# Patient Record
Sex: Male | Born: 1990
Health system: Southern US, Community
[De-identification: ages and names within clinical notes are randomized; demographics above are authoritative.]

## PROBLEM LIST (undated history)

## (undated) DIAGNOSIS — T7840XA Allergy, unspecified, initial encounter: Secondary | ICD-10-CM

## (undated) DIAGNOSIS — Z720 Tobacco use: Secondary | ICD-10-CM

## (undated) DIAGNOSIS — E781 Pure hyperglyceridemia: Secondary | ICD-10-CM

## (undated) DIAGNOSIS — I10 Essential (primary) hypertension: Secondary | ICD-10-CM

## (undated) DIAGNOSIS — E669 Obesity, unspecified: Secondary | ICD-10-CM

## (undated) HISTORY — PX: MANDIBLE SURGERY: SHX707

## (undated) HISTORY — DX: Tobacco use: Z72.0

## (undated) HISTORY — PX: WISDOM TOOTH EXTRACTION: SHX21

## (undated) HISTORY — DX: Essential (primary) hypertension: I10

## (undated) HISTORY — DX: Obesity, unspecified: E66.9

## (undated) HISTORY — DX: Allergy, unspecified, initial encounter: T78.40XA

---
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
## 2017-06-30 ENCOUNTER — Ambulatory Visit: Payer: Self-pay | Admitting: Physician Assistant

## 2017-06-30 DIAGNOSIS — Z0189 Encounter for other specified special examinations: Secondary | ICD-10-CM

## 2017-09-10 ENCOUNTER — Encounter (INDEPENDENT_AMBULATORY_CARE_PROVIDER_SITE_OTHER): Payer: Self-pay

## 2017-09-10 ENCOUNTER — Ambulatory Visit (INDEPENDENT_AMBULATORY_CARE_PROVIDER_SITE_OTHER): Payer: BLUE CROSS/BLUE SHIELD | Admitting: Physician Assistant

## 2017-09-10 ENCOUNTER — Encounter: Payer: Self-pay | Admitting: Physician Assistant

## 2017-09-10 VITALS — BP 121/79 | HR 70 | Ht 71.0 in | Wt 218.0 lb

## 2017-09-10 DIAGNOSIS — F341 Dysthymic disorder: Secondary | ICD-10-CM | POA: Diagnosis not present

## 2017-09-10 DIAGNOSIS — Z6830 Body mass index (BMI) 30.0-30.9, adult: Secondary | ICD-10-CM | POA: Diagnosis not present

## 2017-09-10 DIAGNOSIS — H6122 Impacted cerumen, left ear: Secondary | ICD-10-CM | POA: Diagnosis not present

## 2017-09-10 DIAGNOSIS — F17200 Nicotine dependence, unspecified, uncomplicated: Secondary | ICD-10-CM | POA: Insufficient documentation

## 2017-09-10 DIAGNOSIS — F4010 Social phobia, unspecified: Secondary | ICD-10-CM | POA: Diagnosis not present

## 2017-09-10 DIAGNOSIS — E6609 Other obesity due to excess calories: Secondary | ICD-10-CM | POA: Diagnosis not present

## 2017-09-10 DIAGNOSIS — Z7689 Persons encountering health services in other specified circumstances: Secondary | ICD-10-CM | POA: Diagnosis not present

## 2017-09-10 NOTE — Patient Instructions (Signed)
Persistent Depressive Disorder, Adult Persistent depressive disorder (PDD) is a mental health condition that causes symptoms of low-level depression for 2 years or longer. It may also be called long-term (chronic) depression or dysthymia. PDD may include episodes of more severe depression that last for about 2 weeks (major depressive disorder or MDD). PDD can affect the way you think, feel, and sleep. This condition may also affect your relationships. You may be more likely to get sick if you have PDD. What are the causes? The exact cause of this condition is not known. PDD is most likely caused by a combination of things, which may include:  Genetic factors. These are traits that are passed along from parent to child.  Individual factors. Your personality, your behavior, and the way you handle your thoughts and feelings may contribute to PDD. This includes personality traits and behaviors learned from others.  Physical factors, such as: ? Differences in the part of your brain that controls emotion. This part of your brain may be different than it is in people who do not have PDD. ? Long-term (chronic) medical or psychiatric illnesses.  Social factors. Traumatic experiences or major life changes may play a role in the development of PDD.  What increases the risk? This condition is more likely to develop in women. The following factors may make you more likely to develop PDD:  A family history of depression.  Abnormally low levels of certain brain chemicals.  Traumatic events in childhood, especially abuse or the loss of a parent.  Being under a lot of stress, or long-term stress, especially from upsetting life experiences or losses.  A history of: ? Chronic physical illness. ? Other mental health disorders. ? Substance abuse.  Poor living conditions.  Experiencing social exclusion or discrimination on a regular basis.  What are the signs or symptoms? Symptoms of this condition  occur for most of the day, and may include:  Fatigue or low energy.  Eating too much or too little.  Sleeping too much or too little.  Restlessness or agitation.  Feelings of hopelessness.  Feeling worthless or guilty.  Anxiety.  Poor concentration or difficulty making decisions.  Low self-esteem.  Negative outlook.  Inability to have fun or experience pleasure.  Social withdrawal.  Unexplained physical complaints.  Irritability.  Aggressive behavior or anger.  How is this diagnosed? This condition may be diagnosed based on:  Your symptoms.  Your medical history, including your mental health history. This may involve tests to evaluate your mental health. You may be asked questions about your lifestyle, including any drug and alcohol use, and how long you have had symptoms of PDD.  A physical exam.  Blood tests to rule out other conditions.  You may be diagnosed with PDD if you have had a depressed mood for 2 years or longer, as well as other symptoms of depression. How is this treated? This condition is usually treated by mental health professionals, such as psychologists, psychiatrists, and clinical social workers. You may need more than one type of treatment. Treatment may include:  Psychotherapy. This is also called talk therapy or counseling. Types of psychotherapy include: ? Cognitive behavioral therapy (CBT). This type of therapy teaches you to recognize unhealthy feelings, thoughts, and behaviors, and replace them with positive thoughts and actions. ? Interpersonal therapy (IPT). This helps you to improve the way you relate to and communicate with others. ? Family therapy. This treatment includes members of your family.  Medicine to treat anxiety and   depression, or to help you control certain emotions and behaviors.  Lifestyle changes, such as: ? Limiting alcohol and drug use. ? Exercising regularly. ? Getting plenty of sleep. ? Making healthy eating  choices. ? Spending more time outdoors.  Follow these instructions at home: Activity  Return to your normal activities as told by your health care provider.  Exercise regularly and spend time outdoors as told by your health care provider. General instructions  Take over-the-counter and prescription medicines only as told by your health care provider.  Do not drink alcohol. If you drink alcohol, limit your alcohol intake to no more than 1 drink a day for nonpregnant women and 2 drinks a day for men. One drink equals 12 oz of beer, 5 oz of wine, or 1 oz of hard liquor. Alcohol can affect any antidepressant medicines you are taking. Talk to your health care provider about your alcohol use.  Eat a healthy diet and get plenty of sleep.  Find activities that you enjoy doing, and make time to do them.  Consider joining a support group. Your health care provider may be able to recommend a support group.  Keep all follow-up visits as told by your health care provider. This is important. Where to find more information: National Alliance on Mental Illness  www.nami.org  U.[place]. National Institute of Mental Health  www.nimh.nih.gov  National Suicide Prevention Lifeline  1-800-273-TALK (8255). This is free, 24-hour help.  Contact a health care provider if:  Your symptoms get worse.  You develop new symptoms.  You have trouble sleeping or doing your daily activities. Get help right away if:  You self-harm.  You have serious thoughts about hurting yourself or others.  You see, hear, taste, smell, or feel things that are not present (hallucinate). This information is not intended to replace advice given to you by your health care provider. Make sure you discuss any questions you have with your health care provider. Document Released: 11/23/2012 Document Revised: 08/06/2016 Document Reviewed: 06/20/2016 Elsevier Interactive Patient Education  2017 Elsevier Inc.  

## 2017-09-10 NOTE — Progress Notes (Signed)
HPI:                                                                Kevin Henry is a 26 y.o. male who presents to [name]: Primary Care Sports Medicine today to establish care  Current concerns include: left ear pain  Otalgia   There is pain in the left ear. This is a new problem. The current episode started in the past 7 days (x 2 days). The problem occurs constantly. The problem has been gradually worsening. There has been no fever. The pain is mild. Associated symptoms include rhinorrhea. Pertinent negatives include no coughing, ear discharge or sore throat. Associated symptoms comments: + ear fullness. Treatments tried: H2O2. There is no history of a chronic ear infection.    Past Medical History:  Diagnosis Date  . Allergy   . Obesity   . Tobacco use    Past Surgical History:  Procedure Laterality Date  . MANDIBLE SURGERY    . WISDOM TOOTH EXTRACTION     Social History  Substance Use Topics  . Smoking status: Never Smoker  . Smokeless tobacco: Never Used  . Alcohol use 2.4 oz/week    4 Cans of beer per week   family history includes Breast cancer in his maternal grandmother; Depression in his mother; Diabetes in his maternal grandfather, maternal grandmother, paternal grandfather, and paternal grandmother; Hypertension in his father and mother.  ROS: Review of Systems  Constitutional: Negative.   HENT: Positive for ear pain and rhinorrhea. Negative for ear discharge and sore throat.   Eyes: Negative.   Respiratory: Negative for cough.   Cardiovascular: Negative.   Gastrointestinal: Negative.   Genitourinary: Negative.   Musculoskeletal: Negative.   Skin: Negative.   Neurological: Negative.   Endo/Heme/Allergies: Positive for environmental allergies.  Psychiatric/Behavioral: Positive for depression. Negative for suicidal ideas. The patient is nervous/anxious. The patient does not have insomnia.     Medications: Current Outpatient  Prescriptions  Medication Sig Dispense Refill  . loratadine (CLARITIN) 10 MG tablet Take 10 mg by mouth as needed.     No current facility-administered medications for this visit.    No Known Allergies     Objective:  BP 121/79   Pulse 70   Ht  (1.803 m)   Wt 218 lb (98.9 kg)   BMI 30.40 kg/m  Gen:  alert, not ill-appearing, no distress, appropriate for age HEENT: head normocephalic without obvious abnormality, conjunctiva and cornea clear, left TM obstructed by cerumen, no mastoid tenderness, right TM clear, neck supple, trachea midline Pulm: Normal work of breathing, normal phonation, clear to auscultation bilaterally, no wheezes, rales or rhonchi CV: Normal rate, regular rhythm, s1 and s2 distinct, no murmurs, clicks or rubs  Neuro: alert and oriented x 3, no tremor MSK: extremities atraumatic, normal gait and station Skin: intact, no rashes on exposed skin, no jaundice, no cyanosis Psych: well-groomed, cooperative, good eye contact, euthymic mood, affect mood-congruent, speech is articulate, and thought processes clear and goal-directed    No results found for this or any previous visit (from the past 72 hour(s)). No results found.  Depression screen PHQ 2/9 09/10/2017  Decreased Interest 2  Down, Depressed, Hopeless 2  PHQ - 2 Score 4  Altered  sleeping 3  Tired, decreased energy 1  Change in appetite 0  Feeling bad or failure about yourself  3  Trouble concentrating 0  Moving slowly or fidgety/restless 0  Suicidal thoughts 0  PHQ-9 Score 11  Difficult doing work/chores Somewhat difficult      Assessment and Plan: 26 y.o. male with   1. Encounter to establish care - reviewed PMH, PSH, PFH, medications and allergies - reviewed health maintenance - declines influenza  2. Tobacco use disorder - declines smoking cessation  3. Dysthymia, Social anxiety disorder - PHQ9 score 11. No acute safety issues - discussed treatment options to include SSRI  and/or CBT. Patient declines at this point - encouraged exercise, sleep hygiene, and healthy lifestyle  4. Left ear impacted cerumen Ceruminosis is noted.  Wax is removed with Colace and syringing by CMA. TM visualized and intact  5. Class 1 obesity due to excess calories without serious comorbidity with body mass index (BMI) of 30.0 to 30.9 in adult - heart healthy lifestyle. Continue regular cardiovascular exercise 150 minutes per week   Patient education and anticipatory guidance given Patient agrees with treatment plan Follow-up  In 3 months for CPE w/fasting labs or sooner as needed if symptoms worsen or fail to improve  Levonne Hubert PA-C

## 2018-02-23 ENCOUNTER — Ambulatory Visit (INDEPENDENT_AMBULATORY_CARE_PROVIDER_SITE_OTHER): Payer: BLUE CROSS/BLUE SHIELD | Admitting: Physician Assistant

## 2018-02-23 ENCOUNTER — Encounter: Payer: Self-pay | Admitting: Physician Assistant

## 2018-02-23 VITALS — BP 131/86 | HR 95 | Temp 100.5°F | Wt 236.0 lb

## 2018-02-23 DIAGNOSIS — R509 Fever, unspecified: Secondary | ICD-10-CM | POA: Diagnosis not present

## 2018-02-23 DIAGNOSIS — J111 Influenza due to unidentified influenza virus with other respiratory manifestations: Secondary | ICD-10-CM

## 2018-02-23 DIAGNOSIS — R69 Illness, unspecified: Secondary | ICD-10-CM

## 2018-02-23 LAB — POCT INFLUENZA A/B
Influenza A, POC: NEGATIVE
Influenza B, POC: NEGATIVE

## 2018-02-23 MED ORDER — GUAIFENESIN ER 600 MG PO TB12: 1200.0000 mg | ORAL_TABLET | Freq: Two times a day (BID) | ORAL | Status: DC

## 2018-02-23 MED ORDER — OSELTAMIVIR PHOSPHATE 75 MG PO CAPS: 75.0000 mg | ORAL_CAPSULE | Freq: Two times a day (BID) | ORAL | 0 refills | Status: DC

## 2018-02-23 NOTE — Patient Instructions (Signed)

## 2018-02-23 NOTE — Progress Notes (Signed)
HPI:                                                                Kevin Henry is a 27 y.o. male who presents to [name]: Primary Care Sports Medicine today for influenza-like illness  Influenza  This is a new problem. The current episode started yesterday. The problem occurs constantly. Associated symptoms include chills, congestion, coughing, diaphoresis, fatigue, a fever and myalgias. Pertinent negatives include no abdominal pain, change in bowel habit, nausea, neck pain, rash, sore throat or vomiting. Nothing aggravates the symptoms. Treatments tried: Dayquil. The treatment provided no relief.      Depression screen PHQ 2/9 09/10/2017  Decreased Interest 2  Down, Depressed, Hopeless 2  PHQ - 2 Score 4  Altered sleeping 3  Tired, decreased energy 1  Change in appetite 0  Feeling bad or failure about yourself  3  Trouble concentrating 0  Moving slowly or fidgety/restless 0  Suicidal thoughts 0  PHQ-9 Score 11  Difficult doing work/chores Somewhat difficult    No flowsheet data found.    Past Medical History:  Diagnosis Date  . Allergy   . Obesity   . Tobacco use    Past Surgical History:  Procedure Laterality Date  . MANDIBLE SURGERY    . WISDOM TOOTH EXTRACTION     Social History   Tobacco Use  . Smoking status: Never Smoker  . Smokeless tobacco: Never Used  Substance Use Topics  . Alcohol use: Yes    Alcohol/week: 2.4 oz    Types: 4 Cans of beer per week   family history includes Breast cancer in his maternal grandmother; Depression in his mother; Diabetes in his maternal grandfather, maternal grandmother, paternal grandfather, and paternal grandmother; Hypertension in his father and mother.    ROS: negative except as noted in the HPI  Medications: Current Outpatient Medications  Medication Sig Dispense Refill  . loratadine (CLARITIN) 10 MG tablet Take 10 mg by mouth as needed.    Marland Kitchen guaiFENesin (MUCINEX) 600 MG 12 hr  tablet Take 2 tablets (1,200 mg total) by mouth 2 (two) times daily.    Marland Kitchen oseltamivir (TAMIFLU) 75 MG capsule Take 1 capsule (75 mg total) by mouth 2 (two) times daily. 10 capsule 0   No current facility-administered medications for this visit.    No Known Allergies     Objective:  BP 131/86   Pulse 95   Temp (!) 100.5 F (38.1 C) (Oral)   Wt 236 lb (107 kg)   BMI 32.92 kg/m  Gen:  alert,  ill-appearing, not toxic-appearing, no distress, appropriate for age HEENT: head normocephalic without obvious abnormality, conjunctiva and cornea clear, nasal mucosa edematous, oropharynx clear, moist mucous membranes, neck supple, no cervical adenopathy, trachea midline Pulm: Normal work of breathing, normal phonation, clear to auscultation bilaterally, no wheezes, rales or rhonchi CV: Normal rate, regular rhythm, s1 and s2 distinct, no murmurs, clicks or rubs  Neuro: alert and oriented x 3, no tremor MSK: extremities atraumatic, normal gait and station Skin: intact, no rashes on exposed skin, no jaundice, no cyanosis   No results found for this or any previous visit (from the past 72 hour(s)). No results found.    Assessment and Plan: 27 y.o.  male with   1. Fever chills - POCT Influenza A/B negative  2. Influenza-like illness - he is within the window for Tamiflu. Will treat for influenza given hallmark symptoms. Vitals reviewed and stable. Counseled on symptomatic care. Work note provided - oseltamivir (TAMIFLU) 75 MG capsule; Take 1 capsule (75 mg total) by mouth 2 (two) times daily.  Dispense: 10 capsule; Refill: 0 - guaiFENesin (MUCINEX) 600 MG 12 hr tablet; Take 2 tablets (1,200 mg total) by mouth 2 (two) times daily.     Patient education and anticipatory guidance given Patient agrees with treatment plan Follow-up as needed if symptoms worsen or fail to improve  Levonne Hubertharley E. Cummings PA-C

## 2018-03-01 ENCOUNTER — Encounter: Payer: Self-pay | Admitting: Physician Assistant

## 2018-04-25 ENCOUNTER — Ambulatory Visit: Payer: BLUE CROSS/BLUE SHIELD | Admitting: Physician Assistant

## 2018-06-16 ENCOUNTER — Encounter: Payer: Self-pay | Admitting: Physician Assistant

## 2018-06-16 ENCOUNTER — Ambulatory Visit (INDEPENDENT_AMBULATORY_CARE_PROVIDER_SITE_OTHER): Payer: BLUE CROSS/BLUE SHIELD | Admitting: Physician Assistant

## 2018-06-16 VITALS — BP 135/77 | HR 81 | Wt 241.0 lb

## 2018-06-16 DIAGNOSIS — F17290 Nicotine dependence, other tobacco product, uncomplicated: Secondary | ICD-10-CM

## 2018-06-16 DIAGNOSIS — F410 Panic disorder [episodic paroxysmal anxiety] without agoraphobia: Secondary | ICD-10-CM | POA: Diagnosis not present

## 2018-06-16 DIAGNOSIS — F341 Dysthymic disorder: Secondary | ICD-10-CM

## 2018-06-16 NOTE — Progress Notes (Signed)
HPI:                                                                Kevin Henry is a 27 y.o. male who presents to [name]: Primary Care [facility] Medicine today for anxiety and depression  History is provided by the patient and his girlfriend.   Reports he thinks he has been depressed and struggled with anxiety since grade school. Cites not getting accepted to PT school as a major stressor and making him feel "like a failure." Endorses worsening anxiety and increased panic attacks over the last month. States patients and coworkers have noticed "something is wrong."  Girlfriend reports 4-5 panic attacks last week. No known triggers. Endorses excessive worry, shortness of breath, hyperventilation, palpitations, upset stomach.  Usual bedtime is 11pm, takes an hour or more to fall asleep. Usually wakes up around 6 am. Does not feel rested.  Currently vaping daily Drinks 1-2 beers nightly, reports drinking more after a panic attack  Denies illicit drug use  No history of psychiatric hospitalizations No prior medications  Has tried reducing caffeine intake and practicing sleep hygiene Exercises 2-3 days per week, but has found it harder to exercise lately Has been going fishing, which he reports is one of the few things that makes him feel better.  He also tried some relaxation apps/exercises, which he did not feel were helpful   Currently lives at home with his parents.  Has a bachelor's degree, graduated in 2017. 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 Long distance relationship  Family history: Mom and sister have anxiety disorders and depression Paternal aunt has bipolar disorder   Depression screen [facility] 2/9 06/16/2018 09/10/2017  Decreased Interest 1 2  Down, Depressed, Hopeless 1 2  PHQ - 2 Score 2 4  Altered sleeping 3 3  Tired, decreased energy 3 1   Change in appetite 3 0  Feeling bad or failure about yourself  3 3  Trouble concentrating 0 0  Moving slowly or fidgety/restless 0 0  Suicidal thoughts 0 0  PHQ-9 Score 14 11  Difficult doing work/chores - Somewhat difficult    GAD 7 : Generalized Anxiety Score 06/16/2018  Nervous, Anxious, on Edge 3  Control/stop worrying 3  Worry too much - different things 2  Trouble relaxing 1  Restless 2  Easily annoyed or irritable 1  Afraid - awful might happen 3  Total GAD 7 Score 15      Past Medical History:  Diagnosis Date  . Allergy   . Obesity   . Tobacco use    Past Surgical History:  Procedure Laterality Date  . MANDIBLE SURGERY    . WISDOM TOOTH EXTRACTION     Social History   Tobacco Use  . Smoking status: Never Smoker  . Smokeless tobacco: Never Used  Substance Use Topics  . Alcohol use: Yes    Alcohol/week: 8.4 oz    Types: 14 Cans of beer per week   family history includes Anxiety disorder in his mother and sister; Bipolar disorder in his paternal aunt; Breast cancer in his maternal grandmother; Depression in his mother and sister; Diabetes in his  maternal grandfather, maternal grandmother, paternal grandfather, and paternal grandmother; Hypertension in his father and mother.    ROS: negative except as noted in the HPI  Medications: Current Outpatient Medications  Medication Sig Dispense Refill  . loratadine (CLARITIN) 10 MG tablet Take 10 mg by mouth as needed.     No current facility-administered medications for this visit.    No Known Allergies     Objective:  BP 135/77   Pulse 81   Wt 241 lb (109.3 kg)   BMI 33.61 kg/m  Gen:  alert, not ill-appearing, no distress, appropriate for age HEENT: head normocephalic without obvious abnormality, conjunctiva and cornea clear, trachea midline Pulm: Normal work of breathing, normal phonation Neuro: alert and oriented x 3, no tremor MSK: extremities atraumatic, normal gait and station Skin: intact,  no rashes on exposed skin, no jaundice, no cyanosis Psych: well-groomed, cooperative, downward gaze, tearful throughout the visit, depressed mood, affect mood-congruent, mild psychomotor agitation, speech is articulate, and thought processes clear and goal-directed    No results found for this or any previous visit (from the past 72 hour(s)). No results found.    Assessment and Plan: 27 y.o. male with   Dysthymia  Social anxiety disorder  Other tobacco product nicotine dependence, uncomplicated  PHQ9=14, mildly moderate GAD7=15, moderately severe No acute safety issues Discussed treatment options in detail to include antidepressants such as SSRI/SNRI, cognitive behavioral therapy, as well as alternative therapies Patient declines referral to CBT, stating he does not think it will be helpful He is interested in medications, but would like time to research the options as he is concerned about side effects. Recommended he research Lexapro, Zoloft and Cymbalta specificially Encouraged reducing nicotine-containing products and alcohol Encouraged sleep hygiene and regular aerobic exercise  Patient education and anticipatory guidance given Patient agrees with treatment plan Follow-up in 1 month or sooner as needed if symptoms worsen or fail to improve  Levonne Hubertharley E. Cummings PA-C

## 2018-06-16 NOTE — Patient Instructions (Signed)
Medications to Research: - Escitalopram (Lexapro) - Sertraline (Zoloft) - Duloxetine (Cymbalta)  Other resources: - BelizeBus.at - mindbodygreen.com - 7cupsoftea - https://malone.com/ - online counseling: BetterHelp and Talkspace (not covered by insurance, but affordable self-pay rates)  Safety Plan: if having self-harm or suicidal thoughts Our Office 740-375-0023 [facility] Crisis Hotline (770)728-8491 National Suicide Hotline 1-800-SUICIDE If in immediate danger of harming yourself, go to the nearest emergency room or call 911   Living With Anxiety After being diagnosed with an anxiety disorder, you may be relieved to know why you have felt or behaved a certain way. It is natural to also feel overwhelmed about the treatment ahead and what it will mean for your life. With care and support, you can manage this condition and recover from it. How to cope with anxiety Dealing with stress Stress is your body's reaction to life changes and events, both good and bad. Stress can last just a few hours or it can be ongoing. Stress can play a major role in anxiety, so it is important to learn both how to cope with stress and how to think about it differently. Talk with your health care provider or a counselor to learn more about stress reduction. He or she may suggest some stress reduction techniques, such as:  Music therapy. This can include creating or listening to music that you enjoy and that inspires you.  Mindfulness-based meditation. This involves being aware of your normal breaths, rather than trying to control your breathing. It can be done while sitting or walking.  Centering prayer. This is a kind of meditation that involves focusing on a word, phrase, or sacred image that is meaningful to you and that brings you peace.  Deep breathing. To do this, expand your stomach and inhale slowly through your nose. Hold your  breath for 3-5 seconds. Then exhale slowly, allowing your stomach muscles to relax.  Self-talk. This is a skill where you identify thought patterns that lead to anxiety reactions and correct those thoughts.  Muscle relaxation. This involves tensing muscles then relaxing them.  Choose a stress reduction technique that fits your lifestyle and personality. Stress reduction techniques take time and practice. Set aside 5-15 minutes a day to do them. Therapists can offer training in these techniques. The training may be covered by some insurance plans. Other things you can do to manage stress include:  Keeping a stress diary. This can help you learn what triggers your stress and ways to control your response.  Thinking about how you respond to certain situations. You may not be able to control everything, but you can control your reaction.  Making time for activities that help you relax, and not feeling guilty about spending your time in this way.  Therapy combined with coping and stress-reduction skills provides the best chance for successful treatment. Medicines Medicines can help ease symptoms. Medicines for anxiety include:  Anti-anxiety drugs.  Antidepressants.  Beta-blockers.  Medicines may be used as the main treatment for anxiety disorder, along with therapy, or if other treatments are not working. Medicines should be prescribed by a health care provider. Relationships Relationships can play a big part in helping you recover. Try to spend more time connecting with trusted friends and family members. Consider going to couples counseling, taking family education classes, or going to family therapy. Therapy can help you and others better understand the condition. How to recognize changes in your condition Everyone has a different response to treatment for anxiety. Recovery from anxiety happens when  symptoms decrease and stop interfering with your daily activities at home or work. This may  mean that you will start to:  Have better concentration and focus.  Sleep better.  Be less irritable.  Have more energy.  Have improved memory.  It is important to recognize when your condition is getting worse. Contact your health care provider if your symptoms interfere with home or work and you do not feel like your condition is improving. Where to find help and support: You can get help and support from these sources:  Self-help groups.  Online and [employer]community organizations.  A trusted spiritual leader.  Couples counseling.  Family education classes.  Family therapy.  Follow these instructions at home:  Eat a healthy diet that includes plenty of vegetables, fruits, whole grains, low-fat dairy products, and lean protein. Do not eat a lot of foods that are high in solid fats, added sugars, or salt.  Exercise. Most adults should do the following: ? Exercise for at least 150 minutes each week. The exercise should increase your heart rate and make you sweat (moderate-intensity exercise). ? Strengthening exercises at least twice a week.  Cut down on caffeine, tobacco, alcohol, and other potentially harmful substances.  Get the right amount and quality of sleep. Most adults need 7-9 hours of sleep each night.  Make choices that simplify your life.  Take over-the-counter and prescription medicines only as told by your health care provider.  Avoid caffeine, alcohol, and certain over-the-counter cold medicines. These may make you feel worse. Ask your pharmacist which medicines to avoid.  Keep all follow-up visits as told by your health care provider. This is important. Questions to ask your health care provider  Would I benefit from therapy?  How often should I follow up with a health care provider?  How long do I need to take medicine?  Are there any long-term side effects of my medicine?  Are there any alternatives to taking medicine? Contact a health care provider  if:  You have a hard time staying focused or finishing daily tasks.  You spend many hours a day feeling worried about everyday life.  You become exhausted by worry.  You start to have headaches, feel tense, or have nausea.  You urinate more than normal.  You have diarrhea. Get help right away if:  You have a racing heart and shortness of breath.  You have thoughts of hurting yourself or others. If you ever feel like you may hurt yourself or others, or have thoughts about taking your own life, get help right away. You can go to your nearest emergency department or call:  Your local emergency services (911 in the [place]  A suicide crisis helpline, such as the National Suicide Prevention Lifeline at (628)370-08671-940-378-2434. This is open 24-hours a day.  Summary  Taking steps to deal with stress can help calm you.  Medicines cannot cure anxiety disorders, but they can help ease symptoms.  Family, friends, and partners can play a big part in helping you recover from an anxiety disorder. This information is not intended to replace advice given to you by your health care provider. Make sure you discuss any questions you have with your health care provider. Document Released: 12/01/2016 Document Revised: 12/01/2016 Document Reviewed: 12/01/2016 Elsevier Interactive Patient Education  [employer]2018 Elsevier Inc.

## 2018-06-17 ENCOUNTER — Encounter: Payer: Self-pay | Admitting: Physician Assistant

## 2018-06-17 DIAGNOSIS — F341 Dysthymic disorder: Secondary | ICD-10-CM

## 2018-06-17 DIAGNOSIS — F419 Anxiety disorder, unspecified: Secondary | ICD-10-CM

## 2018-06-17 MED ORDER — ESCITALOPRAM OXALATE 10 MG PO TABS
ORAL_TABLET | ORAL | 0 refills | Status: DC
Start: 2018-06-17 — End: 2018-07-15

## 2018-06-22 ENCOUNTER — Encounter: Payer: Self-pay | Admitting: Physician Assistant

## 2018-06-22 ENCOUNTER — Ambulatory Visit (INDEPENDENT_AMBULATORY_CARE_PROVIDER_SITE_OTHER): Payer: BLUE CROSS/BLUE SHIELD | Admitting: Physician Assistant

## 2018-06-22 VITALS — BP 117/79 | HR 78 | Temp 98.5°F | Wt 234.0 lb

## 2018-06-22 DIAGNOSIS — H1032 Unspecified acute conjunctivitis, left eye: Secondary | ICD-10-CM

## 2018-06-22 MED ORDER — POLYMYXIN B-TRIMETHOPRIM 10000-0.1 UNIT/ML-% OP SOLN: 2.0000 [drp] | Freq: Four times a day (QID) | OPHTHALMIC | 0 refills | Status: AC

## 2018-06-22 MED ORDER — KETOTIFEN FUMARATE 0.025 % OP SOLN: 1.0000 [drp] | Freq: Two times a day (BID) | OPHTHALMIC | 0 refills | Status: AC | PRN

## 2018-06-22 NOTE — Progress Notes (Signed)
HPI:                                                                Kevin Henry is a 27 y.o. male who presents to [name]: Primary Care Sports Medicine today for left eye redness  Conjunctivitis   The current episode started 2 days ago. The onset was sudden. The problem occurs continuously. The problem has been gradually worsening. The problem is moderate. Nothing relieves the symptoms. Nothing aggravates the symptoms. Associated symptoms include eye itching, eye discharge and eye redness. Pertinent negatives include no fever, no decreased vision, no double vision, no photophobia and no eye pain.   Past Medical History:  Diagnosis Date  . Allergy   . Obesity   . Tobacco use    Past Surgical History:  Procedure Laterality Date  . MANDIBLE SURGERY    . WISDOM TOOTH EXTRACTION     Social History   Tobacco Use  . Smoking status: Never Smoker  . Smokeless tobacco: Never Used  Substance Use Topics  . Alcohol use: Yes    Alcohol/week: 8.4 oz    Types: 14 Cans of beer per week   family history includes Anxiety disorder in his mother and sister; Bipolar disorder in his paternal aunt; Breast cancer in his maternal grandmother; Depression in his mother and sister; Diabetes in his maternal grandfather, maternal grandmother, paternal grandfather, and paternal grandmother; Hypertension in his father and mother.    ROS: negative except as noted in the HPI  Medications: Current Outpatient Medications  Medication Sig Dispense Refill  . escitalopram (LEXAPRO) 10 MG tablet Take 0.5 tablets (5 mg total) by mouth at bedtime for 3 days, THEN 1 tablet (10 mg total) at bedtime for 27 days. 30 tablet 0  . loratadine (CLARITIN) 10 MG tablet Take 10 mg by mouth as needed.    Marland Kitchen MELATONIN PO Take 3 mg by mouth.    Marland Kitchen ketotifen (ZADITOR) 0.025 % ophthalmic solution Place 1 drop into the left eye 2 (two) times daily as needed (itching). 5 mL 0  . trimethoprim-polymyxin b  (POLYTRIM) ophthalmic solution Place 2 drops into the left eye 4 (four) times daily for 7 days. 10 mL 0   No current facility-administered medications for this visit.    No Known Allergies     Objective:  BP 117/79   Pulse 78   Temp 98.5 F (36.9 C) (Oral)   Wt 234 lb (106.1 kg)   BMI 32.64 kg/m  Gen:  alert, not ill-appearing, no distress, appropriate for age HEENT: head normocephalic without obvious abnormality, right conjunctiva and cornea clear, left sclera injected, mild amount of crusting eyelid debris, PERRL, EOM's intact, visual acuity 20/20 OU, 20/25 OD, 20/25 OS Pulm: Normal work of breathing, normal phonation Neuro: alert and oriented x 3, no tremor MSK: extremities atraumatic, normal gait and station Skin: intact, no rashes on exposed skin, no jaundice, no cyanosis   No results found for this or any previous visit (from the past 72 hour(s)). No results found.    Assessment and Plan: 27 y.o. male with   Acute conjunctivitis of left eye, unspecified acute conjunctivitis type - Plan: trimethoprim-polymyxin b (POLYTRIM) ophthalmic solution, ketotifen (ZADITOR) 0.025 % ophthalmic solution - reassuring exam, visual acuity  intact, no FB sensation or pain, no photophobia, does not wear contact lenses. High suspicion that this is bacterial given purulent eye drainage absence of URI symptoms. Viral conjuncitivitis is also on the differential. Treating empirically with Polytrim drops. Zaditor bid prn for itching   Patient education and anticipatory guidance given Patient agrees with treatment plan Follow-up as needed if symptoms worsen or fail to improve  Levonne Hubertharley E. Trusten Hume PA-C

## 2018-06-22 NOTE — Patient Instructions (Signed)

## 2018-06-24 ENCOUNTER — Encounter: Payer: Self-pay | Admitting: Physician Assistant

## 2018-07-15 ENCOUNTER — Other Ambulatory Visit: Payer: Self-pay | Admitting: Physician Assistant

## 2018-07-15 DIAGNOSIS — F341 Dysthymic disorder: Secondary | ICD-10-CM

## 2018-07-15 DIAGNOSIS — F419 Anxiety disorder, unspecified: Secondary | ICD-10-CM

## 2018-07-21 ENCOUNTER — Encounter: Payer: Self-pay | Admitting: Physician Assistant

## 2018-07-22 ENCOUNTER — Encounter: Payer: Self-pay | Admitting: Physician Assistant

## 2018-07-25 ENCOUNTER — Ambulatory Visit: Payer: BLUE CROSS/BLUE SHIELD | Admitting: Physician Assistant

## 2018-07-27 MED ORDER — ESCITALOPRAM OXALATE 10 MG PO TABS: 10.0000 mg | ORAL_TABLET | Freq: Every day | ORAL | 0 refills | Status: DC

## 2018-07-27 NOTE — Telephone Encounter (Signed)
Requesting RF on Lexapro. Last RX sent 06-17-18 with no refill. Pt was supposed to follow up 07-14-18 but did not.  Currently has follow up scheduled for 08-09-18. OK to give 30 days to hold pt over until next appt?  Please advise

## 2018-08-02 ENCOUNTER — Ambulatory Visit: Payer: BLUE CROSS/BLUE SHIELD | Admitting: Physician Assistant

## 2018-08-09 ENCOUNTER — Ambulatory Visit (INDEPENDENT_AMBULATORY_CARE_PROVIDER_SITE_OTHER): Payer: BLUE CROSS/BLUE SHIELD | Admitting: Physician Assistant

## 2018-08-09 ENCOUNTER — Encounter: Payer: Self-pay | Admitting: Physician Assistant

## 2018-08-09 VITALS — BP 122/85 | HR 81 | Wt 239.0 lb

## 2018-08-09 DIAGNOSIS — I10 Essential (primary) hypertension: Secondary | ICD-10-CM | POA: Diagnosis not present

## 2018-08-09 DIAGNOSIS — F419 Anxiety disorder, unspecified: Secondary | ICD-10-CM | POA: Insufficient documentation

## 2018-08-09 DIAGNOSIS — F341 Dysthymic disorder: Secondary | ICD-10-CM

## 2018-08-09 MED ORDER — ESCITALOPRAM OXALATE 10 MG PO TABS: 10.0000 mg | ORAL_TABLET | Freq: Every day | ORAL | 1 refills | Status: DC

## 2018-08-09 NOTE — Patient Instructions (Addendum)
For your blood pressure: - Goal <130/80 - monitor and log blood pressures at home - check around the same time each day in a relaxed setting - Limit salt to <2000 mg/day - Follow DASH eating plan - limit alcohol to 2 standard drinks per day for men and 1 per day for women - avoid tobacco products - weight loss: 7% of current body weight - follow-up every 6 months for your blood pressure  - send me a MyChart message with home readings in 2 weeks   Hypertension Hypertension is another name for high blood pressure. High blood pressure forces your heart to work harder to pump blood. This can cause problems over time. There are two numbers in a blood pressure reading. There is a top number (systolic) over a bottom number (diastolic). It is best to have a blood pressure below 120/80. Healthy choices can help lower your blood pressure. You may need medicine to help lower your blood pressure if:  Your blood pressure cannot be lowered with healthy choices.  Your blood pressure is higher than 130/80.  Follow these instructions at home: Eating and drinking  If directed, follow the DASH eating plan. This diet includes: ? Filling half of your plate at each meal with fruits and vegetables. ? Filling one quarter of your plate at each meal with whole grains. Whole grains include whole wheat pasta, brown rice, and whole grain bread. ? Eating or drinking low-fat dairy products, such as skim milk or low-fat yogurt. ? Filling one quarter of your plate at each meal with low-fat (lean) proteins. Low-fat proteins include fish, skinless chicken, eggs, beans, and tofu. ? Avoiding fatty meat, cured and processed meat, or chicken with skin. ? Avoiding premade or processed food.  Eat less than 1,500 mg of salt (sodium) a day.  Limit alcohol use to no more than 1 drink a day for nonpregnant women and 2 drinks a day for men. One drink equals 12 oz of beer, 5 oz of wine, or 1 oz of hard  liquor. Lifestyle  Work with your doctor to stay at a healthy weight or to lose weight. Ask your doctor what the best weight is for you.  Get at least 30 minutes of exercise that causes your heart to beat faster (aerobic exercise) most days of the week. This may include walking, swimming, or biking.  Get at least 30 minutes of exercise that strengthens your muscles (resistance exercise) at least 3 days a week. This may include lifting weights or pilates.  Do not use any products that contain nicotine or tobacco. This includes cigarettes and e-cigarettes. If you need help quitting, ask your doctor.  Check your blood pressure at home as told by your doctor.  Keep all follow-up visits as told by your doctor. This is important. Medicines  Take over-the-counter and prescription medicines only as told by your doctor. Follow directions carefully.  Do not skip doses of blood pressure medicine. The medicine does not work as well if you skip doses. Skipping doses also puts you at risk for problems.  Ask your doctor about side effects or reactions to medicines that you should watch for. Contact a doctor if:  You think you are having a reaction to the medicine you are taking.  You have headaches that keep coming back (recurring).  You feel dizzy.  You have swelling in your ankles.  You have trouble with your vision. Get help right away if:  You get a very bad headache.  You start to feel confused.  You feel weak or numb.  You feel faint.  You get very bad pain in your: ? Chest. ? Belly (abdomen).  You throw up (vomit) more than once.  You have trouble breathing. Summary  Hypertension is another name for high blood pressure.  Making healthy choices can help lower blood pressure. If your blood pressure cannot be controlled with healthy choices, you may need to take medicine. This information is not intended to replace advice given to you by your health care provider. Make sure  you discuss any questions you have with your health care provider. Document Released: 05/25/2008 Document Revised: 11/04/2016 Document Reviewed: 11/04/2016 Elsevier Interactive Patient Education  [employer]2018 Elsevier Inc.

## 2018-08-09 NOTE — Progress Notes (Signed)
HPI:                                                                Kevin HornerZachary A Henry is a 27 y.o. male who [facility]: Primary Care Sports Medicine today for anxiety/depression follow-up  Interval Hx 2 month f/u for anxiety and depression. Currently taking Lexapro 10 mg Mood "pretty good" Sleep is improved, feels rested Has reduced beer consumption, still vaping daily He is exercising more Reports only 1 panic attack since last office visit Still working full-time at 2 jobs, Psychologist, educationalsearching for new employment opportunity  Reports he thinks he has been depressed and struggled with anxiety since grade school. Cites not getting accepted to PT school as a major stressor and making him feel "like a failure." Endorses worsening anxiety and increased panic attacks over the last month. States patients and coworkers have noticed "something is wrong." No history of psychiatric hospitalizations No prior medications   Social Hx: Currently lives at home with his parents.  Has a bachelor's degree, graduated in 2017. Currently works two, part-time jobs as a Engineer, manufacturingT Tech and mover Significant other: Kevin Henry - partner of 2.5 years, [place]. [place] distance [place]  Family Hx: Mom and sister have anxiety disorders and depression Paternal aunt has bipolar disorder   [facility] 08/09/2018 06/16/2018 09/10/2017  Decreased Interest 1 1 2   Down, Depressed, Hopeless 1 1 2   PHQ - 2 Score 2 2 4   Altered sleeping 2 3 3   Tired, decreased energy 1 3 1   Change in appetite 1 3 0  Feeling bad or failure about yourself  2 3 3   Trouble concentrating 0 0 0  Moving slowly or fidgety/restless 0 0 0  Suicidal thoughts 0 0 0  PHQ-9 Score 8 14 11   Difficult doing work/chores - - Somewhat difficult    GAD 7 : Generalized Anxiety Score 08/09/2018 06/16/2018  Nervous, Anxious, on Edge 2 3  Control/stop worrying 1 3  Worry too much - different things 0 2   Trouble relaxing 0 1  Restless 1 2  Easily annoyed or irritable 1 1  Afraid - awful might happen 0 3  Total GAD 7 Score 5 15      Past Medical History:  Diagnosis Date  . Allergy   . Obesity   . Tobacco use    Past Surgical History:  Procedure Laterality Date  . MANDIBLE SURGERY    . WISDOM TOOTH EXTRACTION     Social History   Tobacco Use  . Smoking status: Never Smoker  . Smokeless tobacco: Never Used  Substance Use Topics  . Alcohol use: Yes    Alcohol/week: 14.0 standard drinks    Types: 14 Cans of beer per week   family history includes Anxiety disorder in his mother and sister; Bipolar disorder in his paternal aunt; Breast cancer in his maternal grandmother; Depression in his mother and sister; Diabetes in his maternal grandfather, maternal grandmother, paternal grandfather, and paternal grandmother; Hypertension in his father and mother.    ROS: negative except as noted in the HPI  Medications: Current Outpatient Medications  Medication Sig Dispense Refill  . escitalopram (LEXAPRO) 10 MG tablet Take 1 tablet (10 mg total) by mouth at bedtime.  30 tablet 0  . ketotifen (ZADITOR) 0.025 % ophthalmic solution Place 1 drop into the left eye 2 (two) times daily as needed (itching). 5 mL 0  . loratadine (CLARITIN) 10 MG tablet Take 10 mg by mouth as needed.    Marland Kitchen. MELATONIN PO Take 3 mg by mouth.     No current facility-administered medications for this visit.    No Known Allergies     Objective:  BP 138/85   Pulse 81   Wt 239 lb (108.4 kg)   BMI 33.33 kg/m  Gen:  alert, not ill-appearing, no distress, appropriate for age, obese male HEENT: head normocephalic without obvious abnormality, conjunctiva and cornea clear, trachea midline Pulm: Normal work of breathing, normal phonation Neuro: alert and oriented x 3, no tremor MSK: extremities atraumatic, normal gait and station Skin: intact, no rashes on exposed skin, no jaundice, no cyanosis Psych:  well-groomed, cooperative, good eye contact, euthymic mood, affect mood-congruent, speech is articulate, and thought processes clear and goal-directed    No results found for this or any previous visit (from the past 72 hour(s)). No results found.    Assessment and Plan: 27 y.o. male with   .Diagnoses and all orders for this visit:  Dysthymia -     escitalopram (LEXAPRO) 10 MG tablet; Take 1 tablet (10 mg total) by mouth at bedtime.  Anxiety disorder, unspecified type -     escitalopram (LEXAPRO) 10 MG tablet; Take 1 tablet (10 mg total) by mouth at bedtime.  Hypertension goal BP (blood pressure) < 130/80   Dysthymia/Anxiety PHQ9=8, improved from 14, no acute safety issues GAD7=5, improved from 15 Good response to Lexapro 10 mg Declines increase in dose today  HTN BP Readings from Last 3 Encounters:  08/09/18 138/85  06/22/18 117/79  06/16/18 135/77  - counseled on therapeutic lifestyle changes - patient to monitor and log BP's at home - send MyChart message with readings in 2-4 weeks   Patient education and anticipatory guidance given Patient agrees with treatment plan Follow-up in 3 months or sooner as needed if symptoms worsen or fail to improve  Levonne Hubertharley E. Cloris Flippo PA-C

## 2018-09-14 ENCOUNTER — Other Ambulatory Visit: Payer: Self-pay | Admitting: Physician Assistant

## 2018-09-14 DIAGNOSIS — F341 Dysthymic disorder: Secondary | ICD-10-CM

## 2018-09-14 DIAGNOSIS — F419 Anxiety disorder, unspecified: Secondary | ICD-10-CM

## 2018-11-09 ENCOUNTER — Encounter: Payer: Self-pay | Admitting: Physician Assistant

## 2018-11-09 ENCOUNTER — Ambulatory Visit (INDEPENDENT_AMBULATORY_CARE_PROVIDER_SITE_OTHER): Payer: BLUE CROSS/BLUE SHIELD | Admitting: Physician Assistant

## 2018-11-09 VITALS — BP 151/84 | HR 73 | Wt 257.0 lb

## 2018-11-09 DIAGNOSIS — F341 Dysthymic disorder: Secondary | ICD-10-CM | POA: Diagnosis not present

## 2018-11-09 DIAGNOSIS — F419 Anxiety disorder, unspecified: Secondary | ICD-10-CM | POA: Diagnosis not present

## 2018-11-09 DIAGNOSIS — R030 Elevated blood-pressure reading, without diagnosis of hypertension: Secondary | ICD-10-CM | POA: Diagnosis not present

## 2018-11-09 MED ORDER — ESCITALOPRAM OXALATE 10 MG PO TABS: 10.0000 mg | ORAL_TABLET | Freq: Every day | ORAL | 1 refills | Status: DC

## 2018-11-09 NOTE — Patient Instructions (Signed)
Sleep Hygiene . Limiting daytime naps to 30 minutes . Napping does not make up for inadequate nighttime sleep. However, a short nap of 20-30 minutes can help to improve mood, alertness and performance.  . Avoiding stimulants such as  caffeine and nicotine close to bedtime.  And when it comes to alcohol, moderation is key 4. While alcohol is well-known to help you fall asleep faster, too much close to bedtime can disrupt sleep in the second half of the night as the body begins to process the alcohol.    . Exercising to promote good quality sleep.  As little as 10 minutes of aerobic exercise, such as walking or cycling, can drastically improve nighttime sleep quality.  For the best night's sleep, most people should avoid strenuous workouts close to bedtime. However, the effect of intense nighttime exercise on sleep differs from person to person, so find out what works best for you.   . Steering clear of food that can be disruptive right before sleep.   Heavy or rich foods, fatty or fried meals, spicy dishes, citrus fruits, and carbonated drinks can trigger indigestion for some people. When this occurs close to bedtime, it can lead to painful heartburn that disrupts sleep. . Ensuring adequate exposure to natural light.  This is particularly important for individuals who may not venture outside frequently. Exposure to sunlight during the day, as well as darkness at night, helps to maintain a healthy sleep-wake cycle . . Establishing a regular relaxing bedtime routine.  A regular nightly routine helps the body recognize that it is bedtime. This could include taking warm shower or bath, reading a book, or light stretches. When possible, try to avoid emotionally upsetting conversations and activities before attempting to sleep. . Making sure that the sleep environment is pleasant.  Mattress and pillows should be comfortable. The bedroom should be cool - between 60 and 67 degrees - for optimal sleep. Bright light  from lamps, cell phone and TV screens can make it difficult to fall asleep4, so turn those light off or adjust them when possible. Consider using blackout curtains, eye shades, ear plugs, "white noise" machines, humidifiers, fans and other devices that can make the bedroom more relaxing. . Meditation. YouTube Michael Sealy. There are many smartphone apps as well   

## 2018-11-09 NOTE — Progress Notes (Signed)
HPI:                                                                Kevin HornerZachary A Henry is a 27 y.o. male who presents to [facility]: Primary Care Sports Medicine today for depression f/u  Depression/Anxiety: taking Escitalopram 10 mg without difficulty. Mood" really good lately." He has been exercising regularly, which he feels has had the best effect on his mood. States he has not been sleeping well for this past week. Having difficulty falling asleep. He takes Melatonin 3 mg prn, but has not taken any this week. Denies symptoms of mania/hypomania. Denies suicidal thinking. Denies auditory/visual hallucinations.  [facility] 11/09/2018 08/09/2018 06/16/2018 09/10/2017  Decreased Interest 1 1 1 2   Down, Depressed, Hopeless 1 1 1 2   PHQ - 2 Score 2 2 2 4   Altered sleeping 2 2 3 3   Tired, decreased energy 2 1 3 1   Change in appetite 0 1 3 0  Feeling bad or failure about yourself  1 2 3 3   Trouble concentrating 0 0 0 0  Moving slowly or fidgety/restless 0 0 0 0  Suicidal thoughts 0 0 0 0  PHQ-9 Score 7 8 14 11   Difficult doing work/chores - - - Somewhat difficult    GAD 7 : Generalized Anxiety Score 11/09/2018 08/09/2018 06/16/2018  Nervous, Anxious, on Edge 1 2 3   Control/stop worrying 1 1 3   Worry too much - different things 0 0 2  Trouble relaxing 1 0 1  Restless 0 1 2  Easily annoyed or irritable 0 1 1  Afraid - awful might happen 1 0 3  Total GAD 7 Score 4 5 15       Past Medical History:  Diagnosis Date  . Allergy   . Obesity   . Tobacco use    Past Surgical History:  Procedure Laterality Date  . MANDIBLE SURGERY    . WISDOM TOOTH EXTRACTION     Social History   Tobacco Use  . Smoking status: Never Smoker  . Smokeless tobacco: Never Used  Substance Use Topics  . Alcohol use: Yes    Alcohol/week: 14.0 standard drinks    Types: 14 Cans of beer per week   family history includes Anxiety disorder in his mother and sister; Bipolar  disorder in his paternal aunt; Breast cancer in his maternal grandmother; Depression in his mother and sister; Diabetes in his maternal grandfather, maternal grandmother, paternal grandfather, and paternal grandmother; Hypertension in his father and mother.    ROS: negative except as noted in the HPI  Medications: Current Outpatient Medications  Medication Sig Dispense Refill  . escitalopram (LEXAPRO) 10 MG tablet Take 1 tablet (10 mg total) by mouth at bedtime. 90 tablet 1  . ketotifen (ZADITOR) 0.025 % ophthalmic solution Place 1 drop into the left eye 2 (two) times daily as needed (itching). 5 mL 0  . loratadine (CLARITIN) 10 MG tablet Take 10 mg by mouth as needed.    Marland Kitchen. MELATONIN PO Take 3 mg by mouth.     No current facility-administered medications for this visit.    No Known Allergies     Objective:  BP (!) 151/84   Pulse 73   Wt 257 lb (116.6  kg)   BMI 35.84 kg/m  Gen:  alert, not ill-appearing, no distress, appropriate for age, obese male HEENT: head normocephalic without obvious abnormality, conjunctiva and cornea clear, trachea midline Pulm: Normal work of breathing, normal phonation Neuro: alert and oriented x 3, no tremor MSK: extremities atraumatic, normal gait and station Skin: intact, no rashes on exposed skin, no jaundice, no cyanosis Psych: well-groomed, cooperative, good eye contact, euthymic mood, affect mood-congruent, speech is articulate, and thought processes clear and goal-directed  BP Readings from Last 3 Encounters:  11/09/18 (!) 151/84  08/09/18 122/85  06/22/18 117/79     No results found for this or any previous visit (from the past 72 hour(s)). No results found.    Assessment and Plan: 26 y.o. male with   .Augusten was seen today for anxiety and depression.  Diagnoses and all orders for this visit:  Dysthymia -     escitalopram (LEXAPRO) 10 MG tablet; Take 1 tablet (10 mg total) by mouth at bedtime.  Anxiety disorder, unspecified  type -     escitalopram (LEXAPRO) 10 MG tablet; Take 1 tablet (10 mg total) by mouth at bedtime.  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 He thinks he may have some white coat HTN since he feels nervous in the office. Encouraged him to keep a BP log and send his readings via MyChart  Dysthymia/Anxiety: PHQ9=7, GAD7=4; well controlled on lexapro 10 mg. Counseled on sleep hygiene. If sleep difficulties persist, follow-up sooner.   Patient education and anticipatory guidance given Patient agrees with treatment plan Follow-up in 6 months or sooner as needed if symptoms worsen or fail to improve  Levonne Hubert PA-C

## 2018-12-01 ENCOUNTER — Encounter: Payer: Self-pay | Admitting: Physician Assistant

## 2018-12-02 ENCOUNTER — Ambulatory Visit (INDEPENDENT_AMBULATORY_CARE_PROVIDER_SITE_OTHER): Payer: BLUE CROSS/BLUE SHIELD

## 2018-12-02 ENCOUNTER — Encounter: Payer: Self-pay | Admitting: Physician Assistant

## 2018-12-02 ENCOUNTER — Ambulatory Visit (INDEPENDENT_AMBULATORY_CARE_PROVIDER_SITE_OTHER): Payer: BLUE CROSS/BLUE SHIELD | Admitting: Physician Assistant

## 2018-12-02 VITALS — BP 138/82 | HR 95 | Temp 99.3°F | Wt 253.0 lb

## 2018-12-02 DIAGNOSIS — R05 Cough: Secondary | ICD-10-CM

## 2018-12-02 DIAGNOSIS — R062 Wheezing: Secondary | ICD-10-CM | POA: Diagnosis not present

## 2018-12-02 DIAGNOSIS — R69 Illness, unspecified: Secondary | ICD-10-CM

## 2018-12-02 DIAGNOSIS — R042 Hemoptysis: Secondary | ICD-10-CM | POA: Diagnosis not present

## 2018-12-02 DIAGNOSIS — J111 Influenza due to unidentified influenza virus with other respiratory manifestations: Secondary | ICD-10-CM

## 2018-12-02 LAB — POCT INFLUENZA A/B
Influenza A, POC: NEGATIVE
Influenza B, POC: NEGATIVE

## 2018-12-02 MED ORDER — IPRATROPIUM BROMIDE 0.06 % NA SOLN: 2.0000 | Freq: Four times a day (QID) | NASAL | 0 refills | Status: DC | PRN

## 2018-12-02 MED ORDER — PREDNISONE 50 MG PO TABS: ORAL_TABLET | ORAL | 0 refills | Status: DC

## 2018-12-02 MED ORDER — GUAIFENESIN ER 600 MG PO TB12: 1200.0000 mg | ORAL_TABLET | Freq: Two times a day (BID) | ORAL | Status: DC

## 2018-12-02 NOTE — Patient Instructions (Addendum)
For nasal symptoms/sinusitis: - nasal saline rinses / netti pot several times per day (do this prior to nasal spray) - prescription Atrovent nasal spray: 2 sprays each nostril, up to 4 times per day as needed - warm facial compresses - oral decongestants and antihistamines like Claritin-D and Zyrtec-D may help dry up secretions (caution using decongestants if you have high blood pressure, heart disease or kidney disease) - for sinus headache: Tylenol 1000mg  every 8 hours as needed.   For cough: - Cough is a protective mechanism and an important part of fighting an infection. I encourage you to avoid suppressing your cough with medication during the day if possible.  - Mucinex with at least 8 oz. of water can loosen chest congestion and make cough more productive, which means you will actually cough less - Okay to use a cough suppressant at bedtime in order to rest (Nyquil, Delsym, Robitussin, etc.)  Note: follow package instructions for all over-the-counter medications. If using multi-symptom medications (Dayquil, Theraflu, etc.), check the label for duplicate drug ingredients.   Cough, Adult Coughing is a reflex that clears your throat and your airways. Coughing helps to heal and protect your lungs. It is normal to cough occasionally, but a cough that happens with other symptoms or lasts a long time may be a sign of a condition that needs treatment. A cough may last only 2-3 weeks (acute), or it may last longer than 8 weeks (chronic). What are the causes? Coughing is commonly caused by:  Breathing in substances that irritate your lungs.  A viral or bacterial respiratory infection.  Allergies.  Asthma.  Postnasal drip.  Smoking.  Acid backing up from the stomach into the esophagus (gastroesophageal reflux).  Certain medicines.  Chronic lung problems, including COPD (or rarely, lung cancer).  Other medical conditions such as heart failure.  Follow these instructions at  home: Pay attention to any changes in your symptoms. Take these actions to help with your discomfort:  Take medicines only as told by your health care provider. ? If you were prescribed an antibiotic medicine, take it as told by your health care provider. Do not stop taking the antibiotic even if you start to feel better. ? Talk with your health care provider before you take a cough suppressant medicine.  Drink enough fluid to keep your urine clear or pale yellow.  If the air is dry, use a cold steam vaporizer or humidifier in your bedroom or your home to help loosen secretions.  Avoid anything that causes you to cough at work or at home.  If your cough is worse at night, try sleeping in a semi-upright position.  Avoid cigarette smoke. If you smoke, quit smoking. If you need help quitting, ask your health care provider.  Avoid caffeine.  Avoid alcohol.  Rest as needed.  Contact a health care provider if:  You have new symptoms.  You cough up pus.  Your cough does not get better after 2-3 weeks, or your cough gets worse.  You cannot control your cough with suppressant medicines and you are losing sleep.  You develop pain that is getting worse or pain that is not controlled with pain medicines.  You have a fever.  You have unexplained weight loss.  You have night sweats. Get help right away if:  You cough up blood.  You have difficulty breathing.  Your heartbeat is very fast. This information is not intended to replace advice given to you by your health care provider. Make  sure you discuss any questions you have with your health care provider. Document Released: 06/05/2011 Document Revised: 05/14/2016 Document Reviewed: 02/13/2015 Elsevier Interactive Patient Education  [employer].

## 2018-12-02 NOTE — Progress Notes (Signed)
HPI:                                                                Kevin Henry is a 27 y.o. male who presents to [name]: Primary Care Sports Medicine today for cough  .Cough  This is a new problem. The current episode started in the past 7 days (x 4 days). The problem has been gradually worsening. The cough is productive of blood-tinged sputum and productive of purulent sputum. Associated symptoms include chest pain (with coughing), myalgias, nasal congestion and rhinorrhea (clear). Pertinent negatives include no ear pain, fever, shortness of breath or wheezing. Associated symptoms comments: + fatigue/malaise + nosebleeds. Nothing aggravates the symptoms. He has tried OTC cough suppressant for the symptoms. The treatment provided no relief. There is no history of pneumonia.     Past Medical History:  Diagnosis Date  . Allergy   . Obesity   . Tobacco use    Past Surgical History:  Procedure Laterality Date  . MANDIBLE SURGERY    . WISDOM TOOTH EXTRACTION     Social History   Tobacco Use  . Smoking status: Never Smoker  . Smokeless tobacco: Never Used  Substance Use Topics  . Alcohol use: Yes    Alcohol/week: 14.0 standard drinks    Types: 14 Cans of beer per week   family history includes Anxiety disorder in his mother and sister; Bipolar disorder in his paternal aunt; Breast cancer in his maternal grandmother; Depression in his mother and sister; Diabetes in his maternal grandfather, maternal grandmother, paternal grandfather, and paternal grandmother; Hypertension in his father and mother.    ROS: negative except as noted in the HPI  Medications: Current Outpatient Medications  Medication Sig Dispense Refill  . escitalopram (LEXAPRO) 10 MG tablet Take 1 tablet (10 mg total) by mouth at bedtime. 90 tablet 1  . ketotifen (ZADITOR) 0.025 % ophthalmic solution Place 1 drop into the left eye 2 (two) times daily as needed (itching). 5 mL 0  .  loratadine (CLARITIN) 10 MG tablet Take 10 mg by mouth as needed.    Marland Kitchen MELATONIN PO Take 3 mg by mouth.     No current facility-administered medications for this visit.    No Known Allergies     Objective:  BP 138/82   Pulse 95   Temp 99.3 F (37.4 C) (Oral)   Wt 253 lb (114.8 kg)   SpO2 95%   BMI 35.29 kg/m  Gen:  alert, ill-appearing, not toxic-appearing, no distress, appropriate for age HEENT: head normocephalic without obvious abnormality, conjunctiva and cornea clear, TM's pearly gray and semi-transparent, nasal mucosa edematous, no sinus tenderness, oropharynx clear, moist mucous membranes, neck supple, no cervical adenopathy, trachea midline Pulm: Normal work of breathing, normal phonation, expiratory wheezes right lung fields CV: Normal rate, regular rhythm, s1 and s2 distinct, no murmurs, clicks or rubs  Neuro: alert and oriented x 3, no tremor MSK: extremities atraumatic, normal gait and station Skin: intact, no rashes on exposed skin, no jaundice, no cyanosis   No results found for this or any previous visit (from the past 72 hour(s)). No results found.    Assessment and Plan: 27 y.o. male with   .Kevin Henry was seen today for cough.  Diagnoses and all orders for this visit:  Influenza-like illness -     ipratropium (ATROVENT) 0.06 % nasal spray; Place 2 sprays into both nostrils 4 (four) times daily as needed for rhinitis. -     guaiFENesin (MUCINEX) 600 MG 12 hr tablet; Take 2 tablets (1,200 mg total) by mouth 2 (two) times daily.  Wheezing on right side of chest on exhalation -     predniSONE (DELTASONE) 50 MG tablet; One tab PO daily for 5 days. -     DG Chest 2 View   Afebrile, no tachypnea, no tachycardia, pulse ox 95% on RA at rest, right-sided expiratory wheezes POC influenza testing negative CXR to assess for infiltrate Personally reviewed CXR, no evidence of pneumonia Supportive care for bronchitis/viral lower respiratory infection Prednisone  burst  for wheezing  Patient education and anticipatory guidance given Patient agrees with treatment plan Follow-up as needed if symptoms worsen or fail to improve  Levonne Hubertharley E. Cummings PA-C

## 2018-12-07 ENCOUNTER — Encounter: Payer: Self-pay | Admitting: Physician Assistant

## 2019-04-28 IMAGING — DX DG CHEST 2V
2 series · 2 of 2 positions shown · non-contrast
Comparison: None.

CLINICAL DATA: Productive cough.  Blood in sputum yesterday.

EXAM:
CHEST - 2 VIEW

[chest pa]
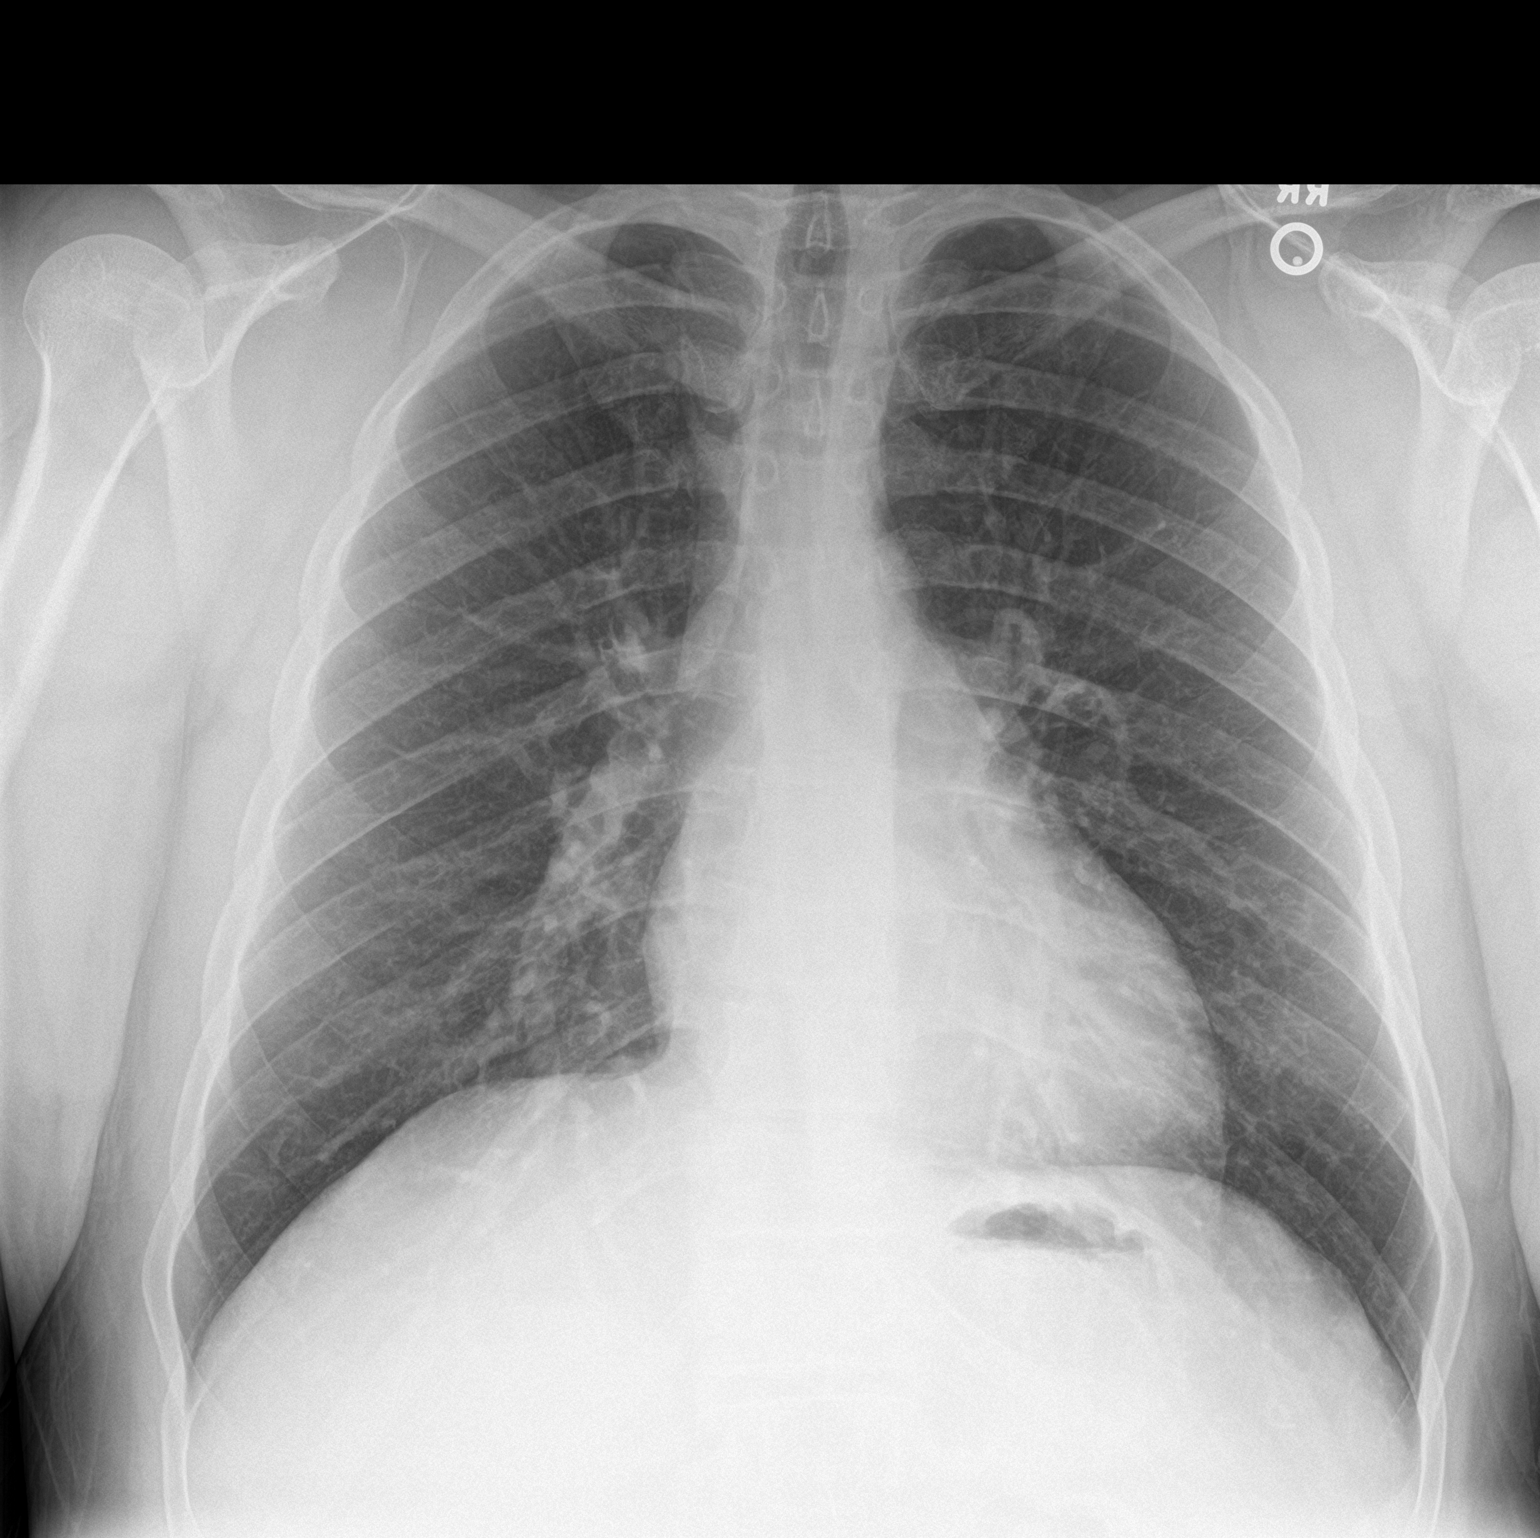

[chest lat]
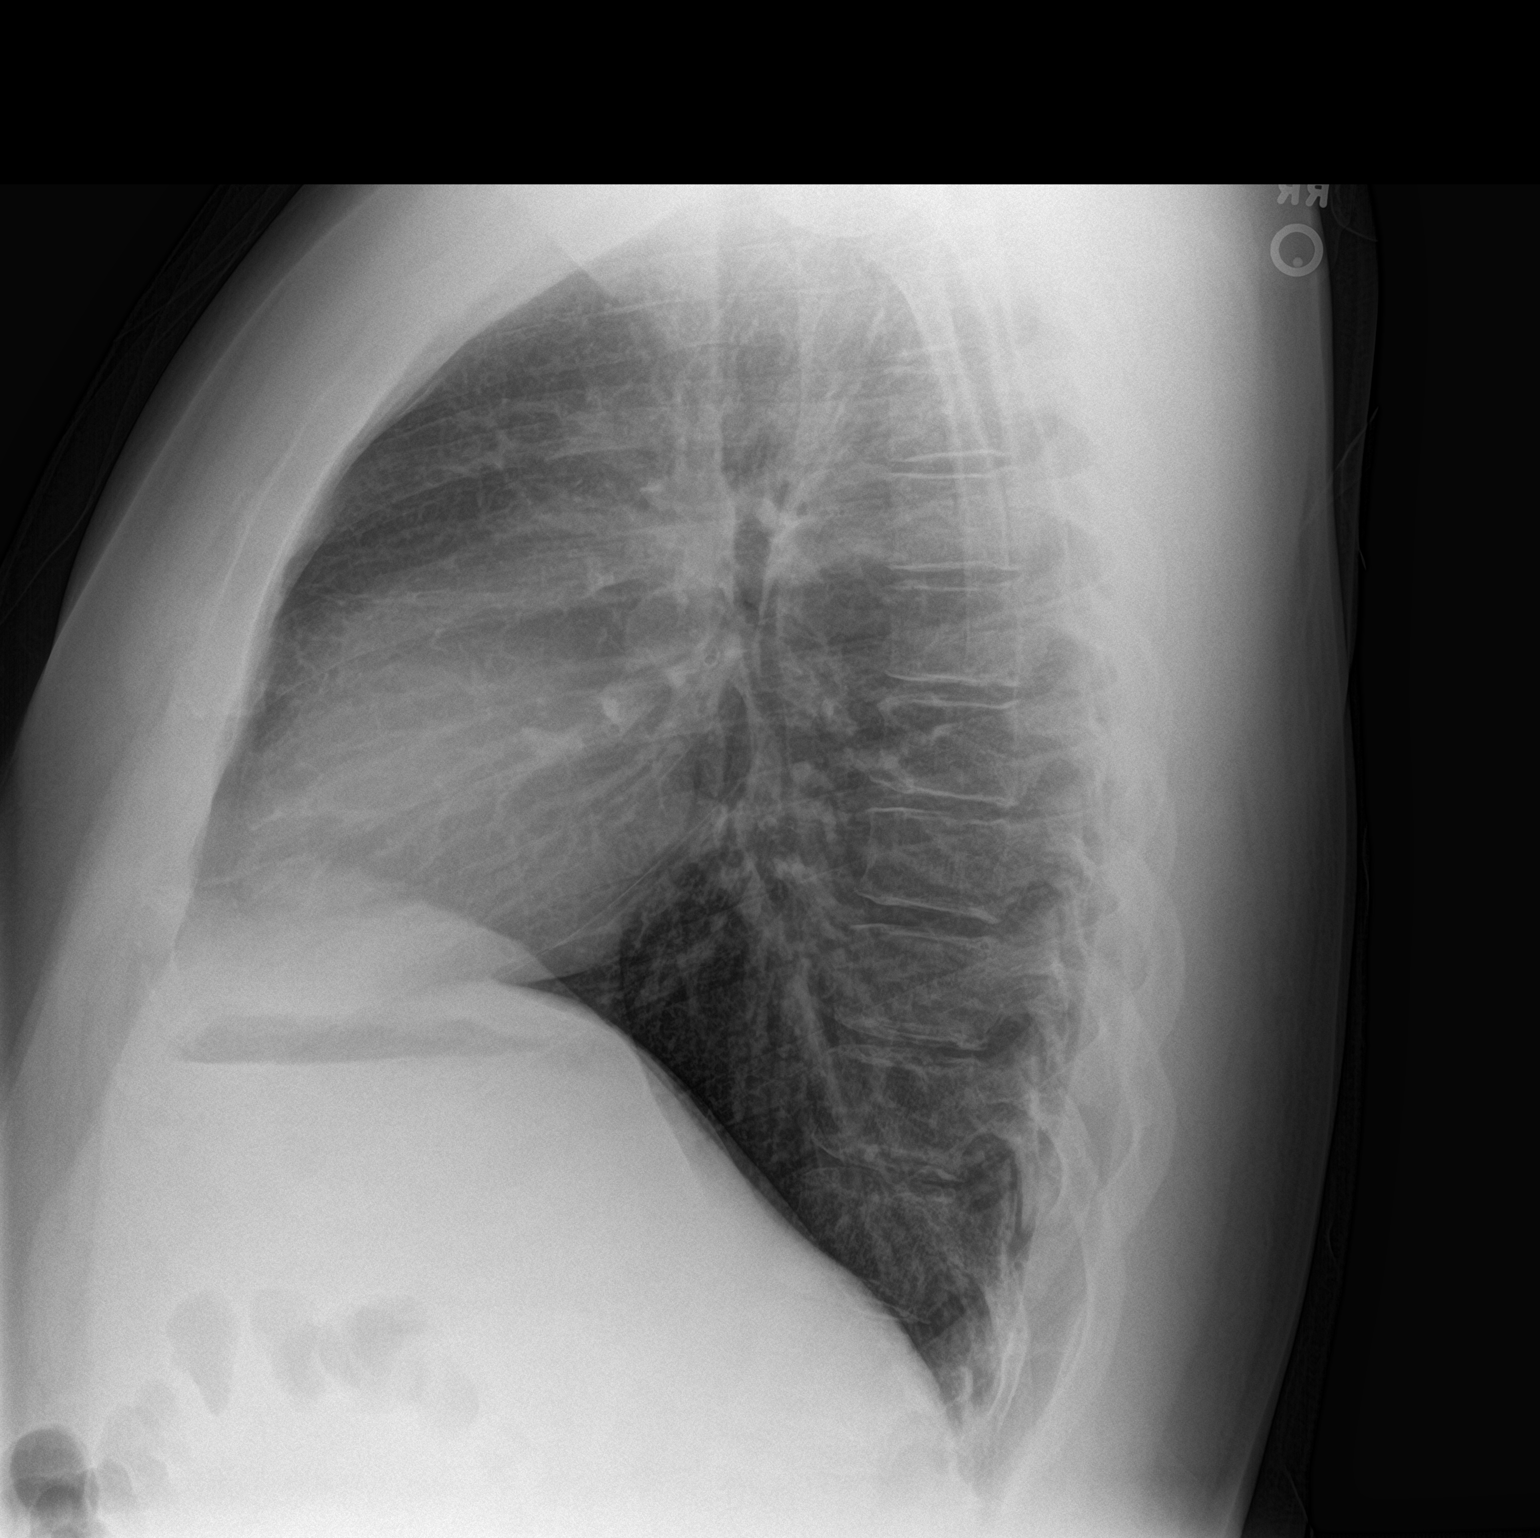

[2 of 2 positions shown; findings below may reference images not displayed]

FINDINGS: The heart size and mediastinal contours are within normal limits.
Both lungs are clear. The visualized skeletal structures are
unremarkable.
IMPRESSION: No active cardiopulmonary disease.

## 2019-05-10 ENCOUNTER — Ambulatory Visit (INDEPENDENT_AMBULATORY_CARE_PROVIDER_SITE_OTHER): Payer: BLUE CROSS/BLUE SHIELD | Admitting: Physician Assistant

## 2019-05-10 ENCOUNTER — Encounter: Payer: Self-pay | Admitting: Physician Assistant

## 2019-05-10 VITALS — BP 143/104 | HR 93 | Wt 250.0 lb

## 2019-05-10 DIAGNOSIS — F413 Other mixed anxiety disorders: Secondary | ICD-10-CM | POA: Diagnosis not present

## 2019-05-10 DIAGNOSIS — Z131 Encounter for screening for diabetes mellitus: Secondary | ICD-10-CM

## 2019-05-10 DIAGNOSIS — R7989 Other specified abnormal findings of blood chemistry: Secondary | ICD-10-CM | POA: Diagnosis not present

## 2019-05-10 DIAGNOSIS — I10 Essential (primary) hypertension: Secondary | ICD-10-CM | POA: Diagnosis not present

## 2019-05-10 DIAGNOSIS — Z13220 Encounter for screening for lipoid disorders: Secondary | ICD-10-CM

## 2019-05-10 DIAGNOSIS — Z1329 Encounter for screening for other suspected endocrine disorder: Secondary | ICD-10-CM

## 2019-05-10 DIAGNOSIS — Z1389 Encounter for screening for other disorder: Secondary | ICD-10-CM

## 2019-05-10 NOTE — Progress Notes (Signed)
Virtual Visit via Video Note  I connected with Kevin Henry on 05/10/19 at  8:10 AM EDT by a video enabled telemedicine application and verified that I am speaking with the correct person using two identifiers.   I discussed the limitations of evaluation and management by telemedicine and the availability of in person appointments. The patient expressed understanding and agreed to proceed.  History of Present Illness: HPI:                                                                Kevin Henry is a 28 y.o. male    Mood - "good," self-discontinued Lexapro 5 months ago. Reports he forgot to take it for about a week and then decided not to continue it. Denies depressed mood/anhedonia. 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 He is very happy with his job and recently purchased a car.  Depression screen [facility] 2/9 05/10/2019 11/09/2018 08/09/2018 06/16/2018 09/10/2017  Decreased Interest 0 1 1 1 2   Down, Depressed, Hopeless 0 1 1 1 2   PHQ - 2 Score 0 2 2 2 4   Altered sleeping 2 2 2 3 3   Tired, decreased energy 2 2 1 3 1   Change in appetite 0 0 1 3 0  Feeling bad or failure about yourself  0 1 2 3 3   Trouble concentrating 0 0 0 0 0  Moving slowly or fidgety/restless 0 0 0 0 0  Suicidal thoughts 0 0 0 0 0  PHQ-9 Score 4 7 8 14 11   Difficult doing work/chores - - - - Somewhat difficult    GAD 7 : Generalized Anxiety Score 05/10/2019 11/09/2018 08/09/2018 06/16/2018  Nervous, Anxious, on Edge 1 1 2 3   Control/stop worrying 0 1 1 3   Worry too much - different things 0 0 0 2  Trouble relaxing 1 1 0 1  Restless 1 0 1 2  Easily annoyed or irritable 1 0 1 1  Afraid - awful might happen 0 1 0 3  Total  GAD 7 Score 4 4 5 15       Past Medical History:  Diagnosis Date  . Allergy   . Obesity   . Tobacco use    Past Surgical History:  Procedure Laterality Date  . MANDIBLE SURGERY    . WISDOM TOOTH EXTRACTION     Social History   Tobacco Use  . Smoking status: Never Smoker  . Smokeless tobacco: Never Used  Substance Use Topics  . Alcohol use: Yes    Alcohol/week: 14.0 standard drinks    Types: 14 Cans of beer per week   family history includes Anxiety disorder in his mother and sister; Bipolar disorder in his paternal aunt; Breast cancer in his maternal grandmother; Depression in his mother and sister; Diabetes in his maternal grandfather, maternal grandmother, paternal grandfather, and paternal grandmother; Hypertension in his father and mother.    ROS: negative except as noted in the HPI  Medications:  Current Outpatient Medications  Medication Sig Dispense Refill  . loratadine (CLARITIN) 10 MG tablet Take 10 mg by mouth as needed.    Marland Kitchen. MELATONIN PO Take 3 mg by mouth.    . escitalopram (LEXAPRO) 10 MG tablet Take 1 tablet (10 mg total) by mouth at bedtime. (Patient not taking: Reported on 05/10/2019) 90 tablet 1  . ketotifen (ZADITOR) 0.025 % ophthalmic solution Place 1 drop into the left eye 2 (two) times daily as needed (itching). 5 mL 0   No current facility-administered medications for this visit.    No Known Allergies     Objective:  BP (!) 170/85   Wt 250 lb (113.4 kg)   BMI 34.87 kg/m  Vitals:   05/10/19 0804 05/10/19 0827  BP: (!) 170/85 (!) 143/104  Pulse:  93  Gen:  alert, not ill-appearing, no distress, appropriate for age HEENT: head normocephalic without obvious abnormality, conjunctiva and cornea clear, trachea midline Pulm: Normal work of breathing, normal phonation Neuro: alert and oriented x 3 Psych: cooperative, euthymic mood, affect mood-congruent, speech is articulate, normal rate and volume; thought processes clear and goal-directed,  normal judgment, good insight   BP Readings from Last 3 Encounters:  05/10/19 (!) 170/85  12/02/18 138/82  11/09/18 (!) 151/84   Wt Readings from Last 3 Encounters:  05/10/19 250 lb (113.4 kg)  12/02/18 253 lb (114.8 kg)  11/09/18 257 lb (116.6 kg)     Assessment and Plan: 28 y.o. male with   .Kevin Henry was seen today for medication management.  Diagnoses and all orders for this visit:  Hypertension goal BP (blood pressure) < 130/80 -     TSH + free T4 -     COMPLETE METABOLIC PANEL WITH GFR -     Lipid Panel w/reflex Direct LDL -     Urinalysis, Routine w reflex microscopic  Other mixed anxiety disorders  Screening for thyroid disorder -     TSH + free T4  Screening for lipid disorders -     Lipid Panel w/reflex Direct LDL  Screening for diabetes mellitus -     COMPLETE METABOLIC PANEL WITH GFR  Screening for blood or protein in urine -     Urinalysis, Routine w reflex microscopic   Patient really struggles with taking a daily medication, so we will attempt to manage his HTN with aggressive lifestyle changes incl losing 7% of current body weight, nicotine cessation, and reducing alcohol consumption. He is already exceeded the aerobic exercise recommendations and should continue this CMP, TSH, UA, lipids pending today Self-monitoring instructions provided via MyChart F/u in 2 weeks via MyChart and provide home BP readings  Follow Up Instructions:    I discussed the assessment and treatment plan with the patient. The patient was provided an opportunity to ask questions and all were answered. The patient agreed with the plan and demonstrated an understanding of the instructions.   The patient was advised to call back or seek an in-person evaluation if the symptoms worsen or if the condition fails to improve as anticipated.  I provided 15 minutes of non-face-to-face time during this encounter.   Carlis Stableharley Elizabeth Rayley Gao, [place]PA-C

## 2019-05-11 ENCOUNTER — Encounter: Payer: Self-pay | Admitting: Physician Assistant

## 2019-05-11 DIAGNOSIS — E781 Pure hyperglyceridemia: Secondary | ICD-10-CM

## 2019-05-11 HISTORY — DX: Pure hyperglyceridemia: E78.1

## 2019-05-11 LAB — URINALYSIS, ROUTINE W REFLEX MICROSCOPIC
Bilirubin Urine: NEGATIVE
Glucose, UA: NEGATIVE
Hgb urine dipstick: NEGATIVE
Ketones, ur: NEGATIVE
Leukocytes,Ua: NEGATIVE
Nitrite: NEGATIVE
Protein, ur: NEGATIVE
Specific Gravity, Urine: 1.022 (ref 1.001–1.03)
pH: 5.5 (ref 5.0–8.0)

## 2019-05-11 LAB — COMPLETE METABOLIC PANEL WITH GFR
AG Ratio: 2.1 (calc) (ref 1.0–2.5)
ALT: 21 U/L (ref 9–46)
AST: 26 U/L (ref 10–40)
Albumin: 4.5 g/dL (ref 3.6–5.1)
Alkaline phosphatase (APISO): 60 U/L (ref 36–130)
BUN: 13 mg/dL (ref 7–25)
CO2: 23 mmol/L (ref 20–32)
Calcium: 9.3 mg/dL (ref 8.6–10.3)
Chloride: 106 mmol/L (ref 98–110)
Creat: 0.93 mg/dL (ref 0.60–1.35)
GFR, Est African American: 132 mL/min/{1.73_m2} (ref >=60)
GFR, Est Non African American: 114 mL/min/{1.73_m2} (ref >=60)
Globulin: 2.1 g/dL (calc) (ref 1.9–3.7)
Glucose, Bld: 88 mg/dL (ref 65–99)
Potassium: 5.3 mmol/L (ref 3.5–5.3)
Sodium: 141 mmol/L (ref 135–146)
Total Bilirubin: 0.5 mg/dL (ref 0.2–1.2)
Total Protein: 6.6 g/dL (ref 6.1–8.1)

## 2019-05-11 LAB — LIPID PANEL W/REFLEX DIRECT LDL
Cholesterol: 191 mg/dL (ref <200)
HDL: 43 mg/dL (ref >=40)
LDL Cholesterol (Calc): 105 mg/dL (calc) — ABNORMAL HIGH
Non-HDL Cholesterol (Calc): 148 mg/dL (calc) — ABNORMAL HIGH (ref <130)
Total CHOL/HDL Ratio: 4.4 (calc) (ref <5.0)
Triglycerides: 321 mg/dL — ABNORMAL HIGH (ref <150)

## 2019-05-11 LAB — TSH+FREE T4: TSH W/REFLEX TO FT4: 2.81 mIU/L (ref 0.40–4.50)

## 2019-07-18 ENCOUNTER — Encounter: Payer: Self-pay | Admitting: Physician Assistant

## 2019-07-19 ENCOUNTER — Telehealth (INDEPENDENT_AMBULATORY_CARE_PROVIDER_SITE_OTHER): Payer: BC Managed Care – PPO | Admitting: Physician Assistant

## 2019-07-19 ENCOUNTER — Encounter: Payer: Self-pay | Admitting: Physician Assistant

## 2019-07-19 VITALS — BP 135/90 | HR 89 | Temp 98.0°F

## 2019-07-19 DIAGNOSIS — L089 Local infection of the skin and subcutaneous tissue, unspecified: Secondary | ICD-10-CM | POA: Diagnosis not present

## 2019-07-19 DIAGNOSIS — S80862A Insect bite (nonvenomous), left lower leg, initial encounter: Secondary | ICD-10-CM

## 2019-07-19 DIAGNOSIS — W57XXXA Bitten or stung by nonvenomous insect and other nonvenomous arthropods, initial encounter: Secondary | ICD-10-CM | POA: Diagnosis not present

## 2019-07-19 MED ORDER — CEPHALEXIN 500 MG PO CAPS: 500.0000 mg | ORAL_CAPSULE | Freq: Two times a day (BID) | ORAL | 0 refills | Status: AC

## 2019-07-19 NOTE — Progress Notes (Signed)
Virtual Visit via Video Note  I connected with Kevin Henry on 07/19/19 at  9:50 AM EDT by a video enabled telemedicine application and verified that I am speaking with the correct person using two identifiers.   I discussed the limitations of evaluation and management by telemedicine and the availability of in person appointments. The patient expressed understanding and agreed to proceed.  History of Present Illness: HPI:                                                                Kevin Henry is a 28 y.o. male   CC: skin lesion of left lower leg/?insect bite  Reports being bit or stung by something early last week Began to have swelling and redness of his left lateral lower leg approx 6 days ago on Friday He cleansed area with peroxide and neosporin and covered with a bandage. Symptoms have been gradually improving. Reports there was "funky" bloody drainage when he changed his dressings. Has not had any drainage the last 2 days and states area has scabbed over. He has noted some mild itching. Denies fever, chills, streaking redness, or pain.  Past Medical History:  Diagnosis Date  . Allergy   . Hypertension   . Hypertriglyceridemia 05/11/2019  . Obesity   . Obesity   . Tobacco use    Past Surgical History:  Procedure Laterality Date  . MANDIBLE SURGERY    . WISDOM TOOTH EXTRACTION     Social History   Tobacco Use  . Smoking status: Never Smoker  . Smokeless tobacco: Never Used  Substance Use Topics  . Alcohol use: Yes    Alcohol/week: 14.0 standard drinks    Types: 14 Cans of beer per week   family history includes Anxiety disorder in his mother and sister; Bipolar disorder in his paternal aunt; Breast cancer in his maternal grandmother; Depression in his mother and sister; Diabetes in his maternal grandfather, maternal grandmother, paternal grandfather, and paternal grandmother; Hypertension in his father and mother.    ROS: negative except as noted in  the HPI  Medications: Current Outpatient Medications  Medication Sig Dispense Refill  . ketotifen (ZADITOR) 0.025 % ophthalmic solution Place 1 drop into the left eye 2 (two) times daily as needed (itching). 5 mL 0  . loratadine (CLARITIN) 10 MG tablet Take 10 mg by mouth as needed.    Marland Kitchen. MELATONIN PO Take 3 mg by mouth.     No current facility-administered medications for this visit.    No Known Allergies     Objective:  BP 135/90   Pulse 89   Temp 98 F (36.7 C) (Oral)  Gen:  alert, not ill-appearing, no distress, appropriate for age HEENT: head normocephalic without obvious abnormality, conjunctiva and cornea clear, trachea midline Pulm: Normal work of breathing, normal phonation, Neuro: alert and oriented x 3 Psych: cooperative, speech is articulate, normal rate and volume; thought processes clear and goal-directed, normal judgment, good insight  Skin: photos are from 7/24 and 7/25       No results found for this or any previous visit (from the past 72 hour(s)). No results found.    Assessment and Plan: 28 y.o. male with   .Kevin Henry was seen today for insect bite.  Diagnoses  and all orders for this visit:  Infected insect bite of left leg, initial encounter -     cephALEXin (KEFLEX) 500 MG capsule; Take 1 capsule (500 mg total) by mouth 2 (two) times daily.  patient provided vitals reviewed Afebrile, no tachycardia, BP in stage 1 hypertensive range Physical exam limited by video visit Based on history and photos uploaded to [facility] there appears to be a mild localized infection versus allergic reaction Start Keflex 500 mg bid x 1 week Counseled to use emollient like Aquafor or Vaseline prn for itch. Stop Neosporin Counseled on return precautions  Follow Up Instructions:    I discussed the assessment and treatment plan with the patient. The patient was provided an opportunity to ask questions and all were answered. The patient agreed with the plan and  demonstrated an understanding of the instructions.   The patient was advised to call back or seek an in-person evaluation if the symptoms worsen or if the condition fails to improve as anticipated.  I provided 10 minutes of non-face-to-face time during this encounter.   Trixie Dredge, [place]

## 2020-12-27 DIAGNOSIS — Z20822 Contact with and (suspected) exposure to covid-19: Secondary | ICD-10-CM | POA: Diagnosis not present
